# Patient Record
Sex: Male | Born: 1935 | Race: White | Hispanic: No | Marital: Married
Health system: Southern US, Community
[De-identification: ages and names within clinical notes are randomized; demographics above are authoritative.]

---

## 2006-12-19 ENCOUNTER — Emergency Department: Payer: Self-pay | Admitting: Internal Medicine

## 2006-12-23 ENCOUNTER — Ambulatory Visit: Payer: Self-pay | Admitting: Physician Assistant

## 2010-03-13 ENCOUNTER — Inpatient Hospital Stay: Payer: Self-pay | Admitting: *Deleted

## 2012-02-11 ENCOUNTER — Inpatient Hospital Stay: Payer: Self-pay | Admitting: Internal Medicine

## 2012-02-11 LAB — URINALYSIS, COMPLETE
Ketone: NEGATIVE
Nitrite: NEGATIVE
Ph: 6 (ref 4.5–8.0)
Protein: NEGATIVE
Specific Gravity: 1.01 (ref 1.003–1.030)
Squamous Epithelial: NONE SEEN
WBC UR: NONE SEEN /HPF (ref 0–5)

## 2012-02-11 LAB — CBC
HCT: 35.8 % — ABNORMAL LOW (ref 40.0–52.0)
MCH: 32.9 pg (ref 26.0–34.0)
MCV: 98 fL (ref 80–100)
Platelet: 130 10*3/uL — ABNORMAL LOW (ref 150–440)
RBC: 3.66 10*6/uL — ABNORMAL LOW (ref 4.40–5.90)
RDW: 14.3 % (ref 11.5–14.5)
WBC: 7.3 10*3/uL (ref 3.8–10.6)

## 2012-02-11 LAB — COMPREHENSIVE METABOLIC PANEL
Albumin: 3.2 g/dL — ABNORMAL LOW (ref 3.4–5.0)
Alkaline Phosphatase: 113 U/L (ref 50–136)
Bilirubin,Total: 0.6 mg/dL (ref 0.2–1.0)
Calcium, Total: 8.3 mg/dL — ABNORMAL LOW (ref 8.5–10.1)
Chloride: 107 mmol/L (ref 98–107)
Co2: 25 mmol/L (ref 21–32)
Creatinine: 1.48 mg/dL — ABNORMAL HIGH (ref 0.60–1.30)
EGFR (African American): 46 — ABNORMAL LOW
EGFR (Non-African Amer.): 39 — ABNORMAL LOW
Glucose: 139 mg/dL — ABNORMAL HIGH (ref 65–99)
SGOT(AST): 36 U/L (ref 15–37)
SGPT (ALT): 26 U/L (ref 12–78)
Sodium: 139 mmol/L (ref 136–145)

## 2012-02-11 LAB — CK TOTAL AND CKMB (NOT AT ARMC)
CK, Total: 71 U/L (ref 35–232)
CK-MB: 1.5 ng/mL (ref 0.5–3.6)
CK-MB: 1.7 ng/mL (ref 0.5–3.6)

## 2012-02-11 LAB — PROTIME-INR: INR: 1

## 2012-02-11 LAB — TROPONIN I: Troponin-I: 0.21 ng/mL — ABNORMAL HIGH

## 2012-02-12 LAB — CBC WITH DIFFERENTIAL/PLATELET
Basophil #: 0.1 10*3/uL (ref 0.0–0.1)
Eosinophil %: 7.5 %
HCT: 30.5 % — ABNORMAL LOW (ref 40.0–52.0)
Lymphocyte #: 0.9 10*3/uL — ABNORMAL LOW (ref 1.0–3.6)
MCH: 32 pg (ref 26.0–34.0)
MCHC: 33.2 g/dL (ref 32.0–36.0)
MCV: 97 fL (ref 80–100)
Monocyte #: 1.3 x10 3/mm — ABNORMAL HIGH (ref 0.2–1.0)
Neutrophil #: 3.9 10*3/uL (ref 1.4–6.5)
Neutrophil %: 57.8 %
Platelet: 109 10*3/uL — ABNORMAL LOW (ref 150–440)
RBC: 3.16 10*6/uL — ABNORMAL LOW (ref 4.40–5.90)

## 2012-02-12 LAB — BASIC METABOLIC PANEL
Anion Gap: 7 (ref 7–16)
BUN: 24 mg/dL — ABNORMAL HIGH (ref 7–18)
Chloride: 106 mmol/L (ref 98–107)
Co2: 27 mmol/L (ref 21–32)
Creatinine: 1.33 mg/dL — ABNORMAL HIGH (ref 0.60–1.30)
EGFR (Non-African Amer.): 45 — ABNORMAL LOW
Potassium: 4.1 mmol/L (ref 3.5–5.1)
Sodium: 140 mmol/L (ref 136–145)

## 2012-02-12 LAB — CK TOTAL AND CKMB (NOT AT ARMC)
CK, Total: 64 U/L (ref 35–232)
CK-MB: 1.6 ng/mL (ref 0.5–3.6)

## 2012-02-13 LAB — BASIC METABOLIC PANEL
Calcium, Total: 8.3 mg/dL — ABNORMAL LOW (ref 8.5–10.1)
Chloride: 105 mmol/L (ref 98–107)
Co2: 27 mmol/L (ref 21–32)
EGFR (Non-African Amer.): 49 — ABNORMAL LOW
Osmolality: 283 (ref 275–301)
Potassium: 3.7 mmol/L (ref 3.5–5.1)
Sodium: 140 mmol/L (ref 136–145)

## 2012-02-14 ENCOUNTER — Ambulatory Visit: Payer: Self-pay | Admitting: Internal Medicine

## 2012-02-14 LAB — TROPONIN I: Troponin-I: 0.08 ng/mL — ABNORMAL HIGH

## 2012-02-14 LAB — CK TOTAL AND CKMB (NOT AT ARMC)
CK, Total: 71 U/L (ref 35–232)
CK-MB: 1.6 ng/mL (ref 0.5–3.6)

## 2012-02-14 LAB — MAGNESIUM: Magnesium: 2.1 mg/dL

## 2012-02-14 LAB — TSH: Thyroid Stimulating Horm: 2.36 u[IU]/mL

## 2012-02-15 LAB — CK TOTAL AND CKMB (NOT AT ARMC)
CK, Total: 63 U/L (ref 35–232)
CK-MB: 1.5 ng/mL (ref 0.5–3.6)

## 2012-02-15 LAB — TROPONIN I: Troponin-I: 0.11 ng/mL — ABNORMAL HIGH

## 2012-02-16 LAB — BASIC METABOLIC PANEL
Anion Gap: 9 (ref 7–16)
BUN: 17 mg/dL (ref 7–18)
Calcium, Total: 8.3 mg/dL — ABNORMAL LOW (ref 8.5–10.1)
Creatinine: 1.07 mg/dL (ref 0.60–1.30)
EGFR (African American): >60
EGFR (Non-African Amer.): 58 — ABNORMAL LOW
Glucose: 103 mg/dL — ABNORMAL HIGH (ref 65–99)
Potassium: 4.4 mmol/L (ref 3.5–5.1)

## 2012-02-16 LAB — CULTURE, BLOOD (SINGLE)

## 2012-02-22 ENCOUNTER — Inpatient Hospital Stay: Payer: Self-pay | Admitting: Internal Medicine

## 2012-02-22 LAB — CBC
HCT: 36.7 % — ABNORMAL LOW (ref 40.0–52.0)
HGB: 12.5 g/dL — ABNORMAL LOW (ref 13.0–18.0)
MCHC: 34 g/dL (ref 32.0–36.0)
MCV: 96 fL (ref 80–100)
RBC: 3.82 10*6/uL — ABNORMAL LOW (ref 4.40–5.90)
RDW: 14.3 % (ref 11.5–14.5)
WBC: 12.4 10*3/uL — ABNORMAL HIGH (ref 3.8–10.6)

## 2012-02-22 LAB — BASIC METABOLIC PANEL
BUN: 31 mg/dL — ABNORMAL HIGH (ref 7–18)
Calcium, Total: 8.4 mg/dL — ABNORMAL LOW (ref 8.5–10.1)
Creatinine: 1.3 mg/dL (ref 0.60–1.30)
EGFR (African American): 53 — ABNORMAL LOW
EGFR (Non-African Amer.): 46 — ABNORMAL LOW
Glucose: 126 mg/dL — ABNORMAL HIGH (ref 65–99)
Osmolality: 278 (ref 275–301)
Potassium: 4.5 mmol/L (ref 3.5–5.1)
Sodium: 135 mmol/L — ABNORMAL LOW (ref 136–145)

## 2012-02-22 LAB — CK TOTAL AND CKMB (NOT AT ARMC)
CK, Total: 90 U/L (ref 35–232)
CK-MB: 2.9 ng/mL (ref 0.5–3.6)
CK-MB: 2.7 ng/mL (ref 0.5–3.6)

## 2012-02-22 LAB — TROPONIN I: Troponin-I: 1 ng/mL — ABNORMAL HIGH

## 2012-02-23 LAB — BASIC METABOLIC PANEL
Anion Gap: 9 (ref 7–16)
Calcium, Total: 8.6 mg/dL (ref 8.5–10.1)
Co2: 26 mmol/L (ref 21–32)
Creatinine: 1.47 mg/dL — ABNORMAL HIGH (ref 0.60–1.30)
EGFR (African American): 46 — ABNORMAL LOW
Osmolality: 284 (ref 275–301)
Potassium: 4.3 mmol/L (ref 3.5–5.1)

## 2012-02-23 LAB — DIGOXIN LEVEL: Digoxin: 1.39 ng/mL

## 2012-02-23 LAB — CK TOTAL AND CKMB (NOT AT ARMC)
CK, Total: 70 U/L (ref 35–232)
CK-MB: 3.7 ng/mL — ABNORMAL HIGH (ref 0.5–3.6)

## 2012-02-23 LAB — TROPONIN I: Troponin-I: 1.2 ng/mL — ABNORMAL HIGH

## 2012-02-27 ENCOUNTER — Ambulatory Visit: Payer: Self-pay | Admitting: Internal Medicine

## 2012-04-28 DEATH — deceased

## 2013-03-10 IMAGING — CR DG CHEST 2V
1 series · 3 of 3 positions shown · non-contrast
Comparison: none

REASON FOR EXAM: sob
COMMENTS:

[Series 1: x chest ap · 0.14mm/px · 3 of 3 slices shown]
[im 1/3]
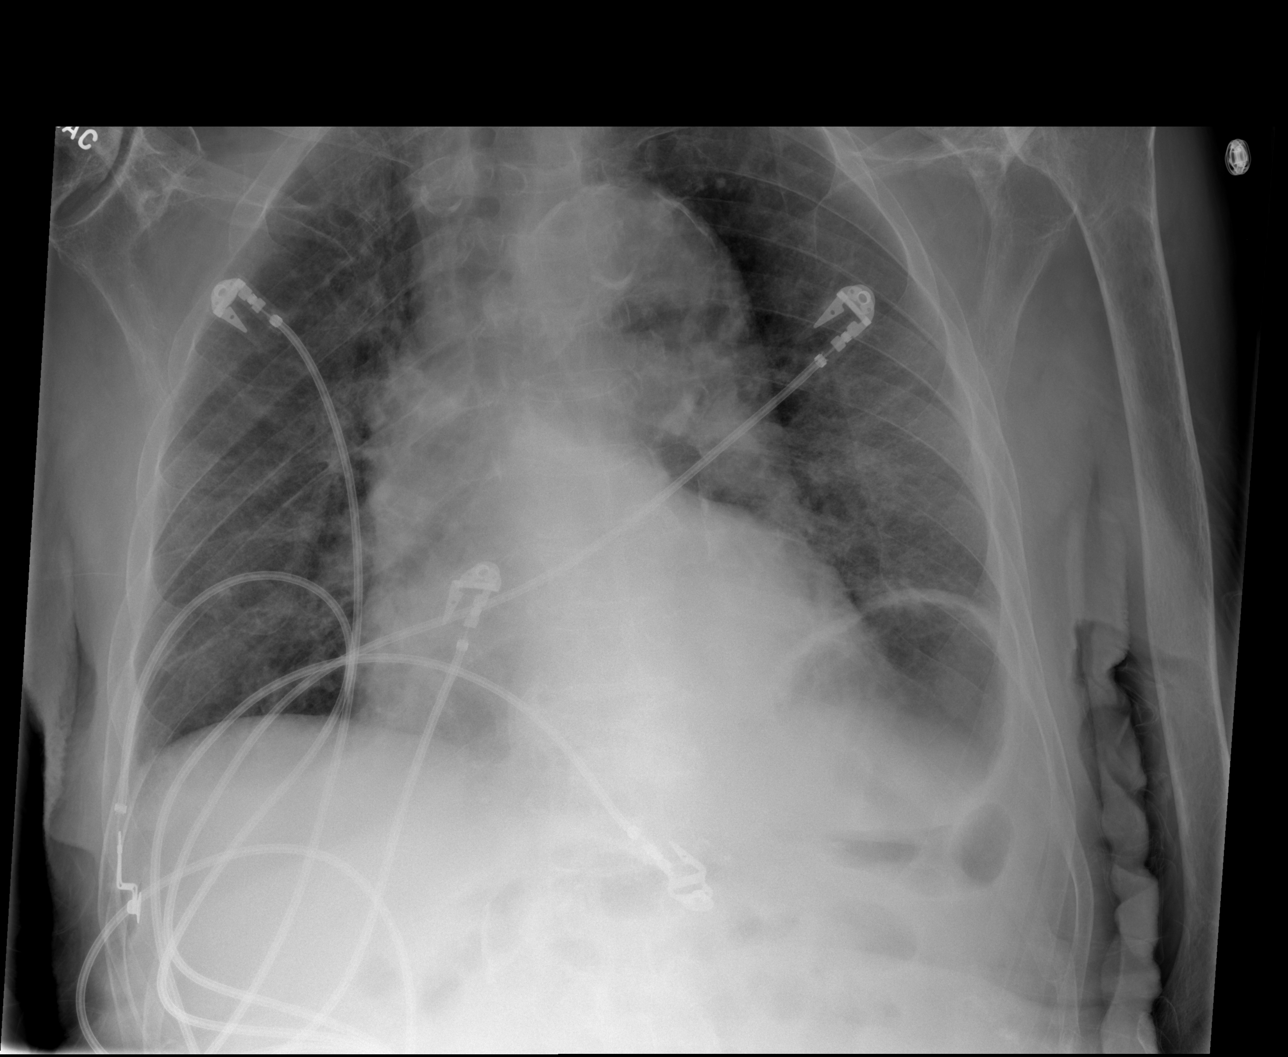
[im 2/3]
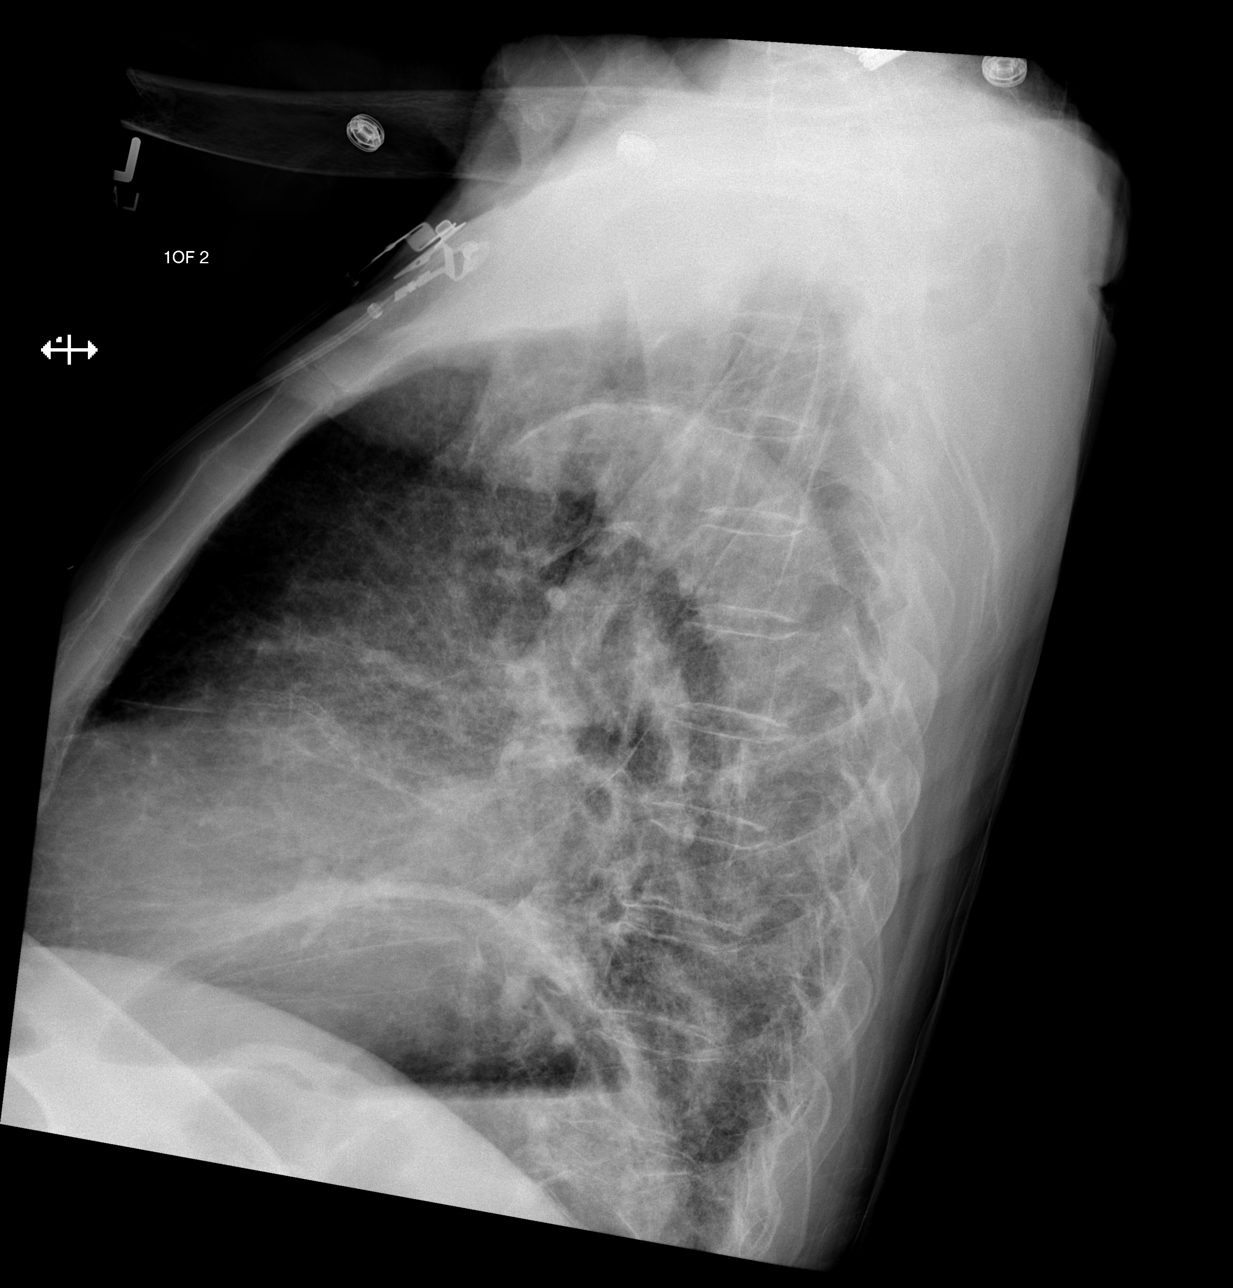
[im 3/3]
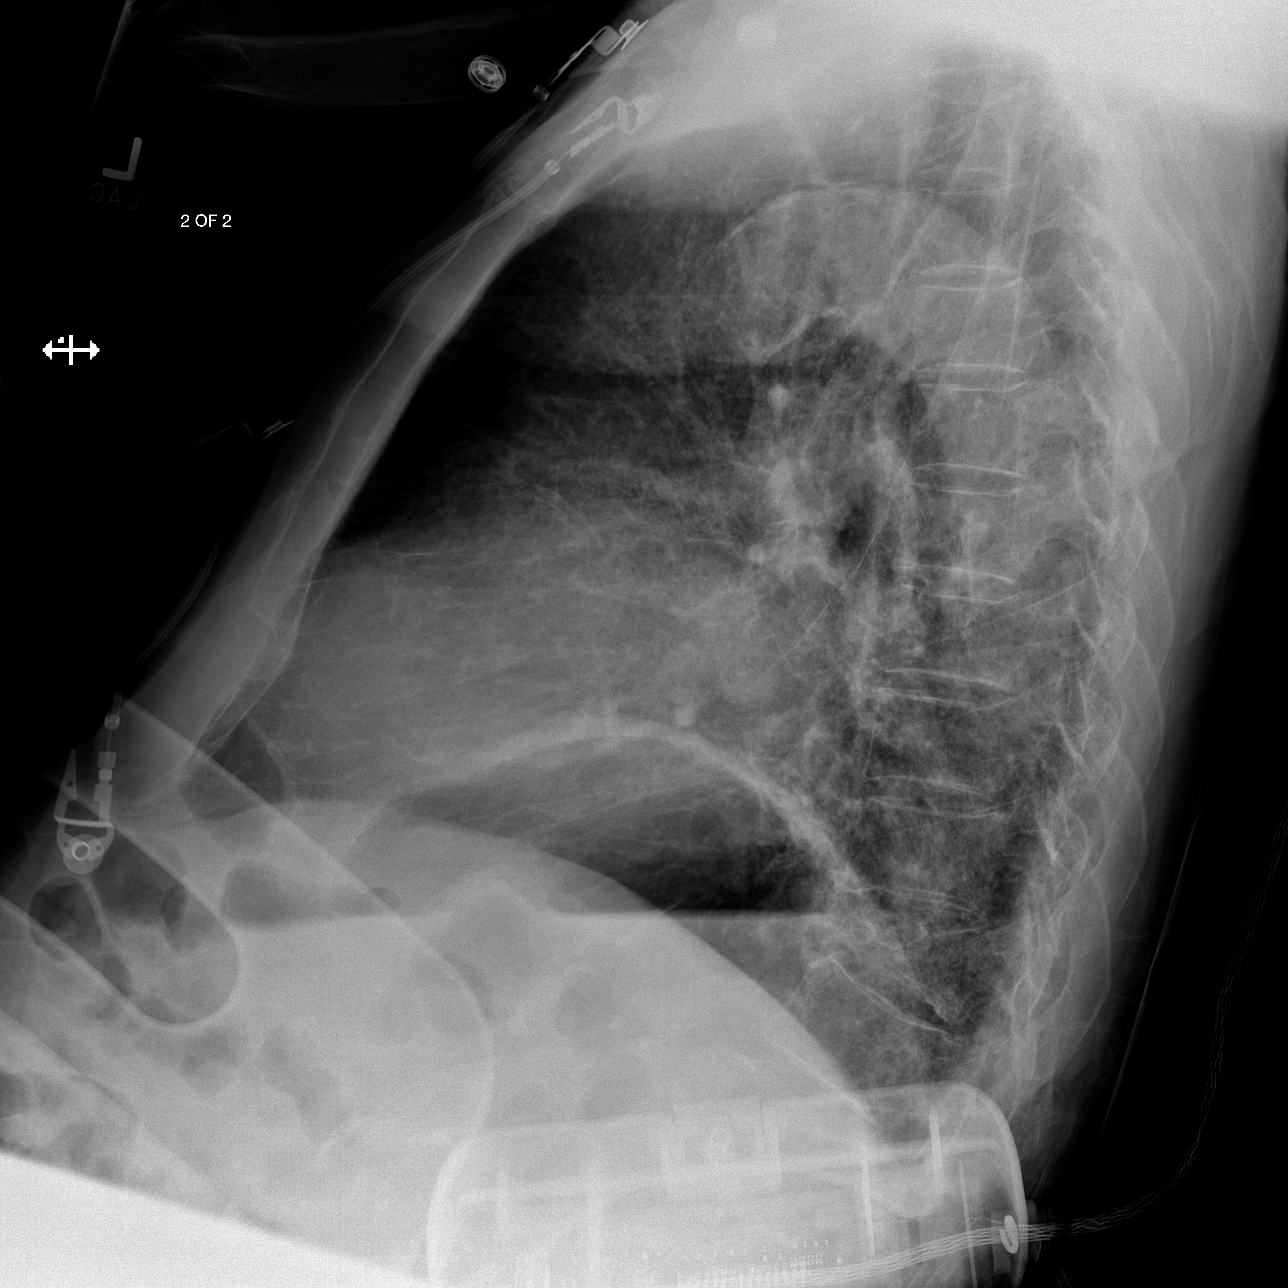

[3 of 3 positions shown; findings below may reference images not displayed]

PROCEDURE:     DXR - DXR CHEST PA (OR AP) AND LATERAL  - February 15, 2012  [DATE]

RESULT:     Comparison is made to the study 12 February, 2012.

The cardiac silhouette is enlarged. There is tortuosity of the descending
thoracic aorta. The pulmonary vascularity is somewhat indistinct and the
interstitial markings are more prominent today than on the earlier study.
There is no significant pleural fluid collection identified. The mediastinum
does not appear acutely widened.
IMPRESSION: The findings suggest low-grade CHF. There is likely
underlying COPD with an element of pulmonary fibrosis. No alveolar pneumonia
or significant pleural fluid collection is demonstrated.

[REDACTED]

## 2014-10-15 NOTE — Consult Note (Signed)
General Aspect patient is a 81 year old male with history of coronary artery disease status post PCI of the LAD in 2000, history of cardiomyopathy with ejection fraction of 30-35% with MR TR and aortic insufficiency, history of hyperlipidemia who now is readmitted with progressive failure to thrive, shortness of breath, weakness.  Chest x-ray reveals probable pulmonary edema.  Patient was admitted with similar complaints approximately 2 weeks ago.  He was treated with diuresis and discharged but now returns with recurrent symptoms.   Patient denies chest pain but complains of shortness of breath.  He is a somewhat difficult historian he appears to rule out for myocardial infarction.  He is currently being treated with diuretics.  Antibiotics ulcer being used.   Physical Exam:   GEN cachectic, thin    NECK No masses    RESP rhonchi  crackles    CARD Regular rate and rhythm  Murmur    Murmur Systolic  Diastolic    Systolic Murmur axilla    Diastolic Murmur Decrescendo    ABD denies tenderness  normal BS    LYMPH negative neck    SKIN normal to palpation    NEURO cranial nerves intact, motor/sensory function intact    PSYCH good insight   Review of Systems:   General: Fatigue  Weakness  Trouble sleeping    Skin: No Complaints    Respiratory: Short of breath    ROS Pt not able to provide ROS    Medications/Allergies Reviewed Medications/Allergies reviewed   Radiology Results: XRay:    27-Aug-13 14:03, Chest PA and Lateral   Chest PA and Lateral    REASON FOR EXAM:    SOB  COMMENTS:       PROCEDURE: DXR - DXR CHEST PA (OR AP) AND LATERAL  - Feb 22 2012  2:03PM     RESULT: Comparison: 02/15/2012 and 02/12/2012    Findings:  Heart size upper limits normal, similar to prior. The aorta is tortuous,   similar to prior. There are bilateral interstitial pulmonary opacities.   There is more focal opacity in the right lower lung which is new from   recent prior. There are  degenerative changes in the bilateral shoulders.   Humeral heads are high ridingbilaterally. There are severe anterior   wedge compression deformities in the lower thoracic and upper lumbar   spine, similar to prior.  IMPRESSION:   Findings of pulmonary edema. New small heterogeneous opacity in the right   lower lung may represent superimposed infection or atelectasis. Followup   PA and lateral radiograph is recommended.    Dictation site: 2          Verified By: Lewie Chamber, M.D., MD    No Known Allergies:     Impression 81 year old male with history of coronary disease valvular heart disease cardiomyopathy who now is readmitted with recurrent congestive heart failure and what appears to be failure to thrive.  He is a difficult historian complains of weakness.  He is on a nonrebreather mask.  He is currently hemodynamically stable and EKG suggests sinus rhythm although he is a teacher fibrillation in the past.  He is not a candidate for chronic anticoagulation.  Would agree with careful diuresis following renal function and blood pressure.  Would also agree with careful use of carvedilol following heart rate and blood pressure.   Agree with palliative care    Plan 1.  Careful diuresis 2.   low-sodium diet 3.  Careful use of carvedilol  falling blood pressure and heart rate. would continue with digoxin at low dose for now but closely follow for evidence of toxicity while diuresing. 4.  Agree with palliative care 5.  Poor prognosis.   Electronic Signatures: Dalia HeadingFath, Tevyn Codd A (MD)  (Signed 28-Aug-13 08:09)  Authored: General Aspect/Present Illness, History and Physical Exam, Review of System, Home Medications, EKG , Radiology, Allergies, Impression/Plan   Last Updated: 28-Aug-13 08:09 by Dalia HeadingFath, Burnette Valenti A (MD)

## 2014-10-15 NOTE — H&P (Signed)
PATIENT NAME:  Tom Price, Tom Price MR#:  [id number] DATE OF BIRTH:  [birth year]  DATE OF ADMISSION:  02/11/2012  REFERRING PHYSICIAN: Dr. Mindi JunkerGottlieb.   FAMILY PHYSICIAN: Dr. Randa LynnLamb.   REASON FOR ADMISSION: Acute respiratory failure with elevated troponin.   HISTORY OF PRESENT ILLNESS: The patient is a 81 year old male who lives at home with his wife but has caretakers during the day. He presents to the Emergency Room with cough, shortness of breath, and questionable fever. In the Emergency Room, the patient was noted to be hypoxic with a room air saturation of 85%. He was afebrile. Chest x-ray revealed interstitial edema/congestive heart failure. His troponin was mildly elevated. He denies chest pain. He is now admitted for further evaluation.   PAST MEDICAL HISTORY:  1. Benign hypertension.  2. Chronic abdominal pain.  3. Chronic dysphagia.  4. Gastroesophageal reflux disease.  5. Aortic insufficiency.  6. Peripheral vascular disease.  7. Diverticulosis.  8. Atherosclerotic cardiovascular disease.  9. History of rectal bleeding.  10. Senile dementia.   MEDICATIONS:  1. Toprol-XL 25 mg p.o. daily.  2. Norvasc 5 mg p.o. daily.  3. Ativan 0.5 mg 1 to 2 p.o. every four hours p.r.n.   ALLERGIES: No known drug allergies.   SOCIAL HISTORY: Negative for alcohol or tobacco abuse.   FAMILY HISTORY: Positive for stroke, but otherwise unremarkable.   REVIEW OF SYSTEMS: Unable to obtain due to his dementia.   PHYSICAL EXAMINATION:  GENERAL: The patient is in no acute distress.   VITAL SIGNS: Vital signs are currently remarkable for blood pressure of 112/63 with heart rate of 80 and a respiratory rate of 20. He is afebrile.   HEENT: Normocephalic, atraumatic. Pupils are equally round and reactive to light and accommodation. Extraocular movements are intact. Sclerae are anicteric. Conjunctivae are clear. Oropharynx is clear.   NECK: Supple without jugular venous distention. No adenopathy or  thyromegaly is noted.   LUNGS: Basilar crackles. Right-sided rhonchi. No wheezes. No dullness.   CARDIAC: Regular rate and rhythm with normal S1 and S2. No significant rubs, murmurs, or gallops. PMI is nondisplaced. Chest wall is nontender.   ABDOMEN: Soft, nontender, with normoactive bowel sounds. No organomegaly or masses were appreciated. No hernias or bruits were noted.   EXTREMITIES: Without clubbing, cyanosis, or edema. Pulses were 1+ bilaterally.   SKIN: Warm and dry without rash or lesions.   NEUROLOGIC: Cranial nerves II through XII grossly intact. Deep tendon reflexes were symmetric. Motor and sensory examination is nonfocal.   PSYCH: The patient was alert and oriented to person with intermittent confusion. He was not oriented to time or place.   LABORATORY, RADIOLOGICAL AND DIAGNOSTIC DATA: CK was 71 with an MB of 1.5 and a troponin of 0.15. White count was 7.3 with a hemoglobin of 12.0. Glucose was 139 with a BUN of 24 and a creatinine of 1.48 with a GFR of 39. Sodium 139 with a potassium of 4.2. Chest x-ray revealed bilateral diffuse interstitial thickening representing interstitial edema. EKG revealed sinus rhythm with left bundle branch block.   ASSESSMENT:  1. Acute respiratory failure.  2. Acute systolic congestive heart failure.  3. Abnormal EKG.  4. Elevated troponin.  5. Questionable pneumonia.  6. Dementia.   PLAN:  1. The patient will be admitted to telemetry with aspirin, Lovenox, beta blocker therapy, and oral nitrates at this time.  2. We will supplement oxygen and wean as tolerated.  3. We will follow serial cardiac enzymes and obtain an echocardiogram.  4. We will also obtain a cardiology consult.  5. We will diuresis with IV Lasix at this time and follow his renal function closely.  6. Follow up routine labs and chest x-ray in the morning.  7. We will begin empiric IV antibiotics with Xopenex and Atrovent SVNs because of the questionable  pneumonia/pneumonitis.  8. At this time, the patient is a FULL CODE according to family members.  9. We will consult physical therapy and care manager while he is in the hospital.  10. Further treatment and evaluation will depend upon the patient's progress.   TOTAL TIME SPENT ON ADMISSION: 50 minutes.   ____________________________ Duane Lope Judithann Sheen, MD jds:ap D: 02/11/2012 13:28:11 ET T: 02/11/2012 14:03:05 ET JOB#: [id number]  cc: Duane Lope. Judithann Sheen, MD, <Dictator> Reola Mosher. Randa Lynn, MD Lakeishia Truluck Rodena Medin MD ELECTRONICALLY SIGNED 02/11/2012 16:49

## 2014-10-15 NOTE — H&P (Signed)
PATIENT NAME:  Tom Price, Tom Price MR#:  [id number] DATE OF BIRTH:  Mar 27, 1914  DATE OF ADMISSION:  02/22/2012  PRIMARY CARE PHYSICIAN: Alonna Buckler, MD   CARDIOLOGIST: Harold Hedge, MD   PRESENTING COMPLAINT: Shortness of breath and low-grade fever.   HISTORY OF PRESENT ILLNESS: History is obtained from the patient, old records, and the patient's daughter, who is Midwife in the Emergency Room. Tom Price  is a very pleasant 81 year old Caucasian gentleman with a history of cardiomyopathy, systolic, ejection fraction of 30% with severe MR, TR and recently was admitted about a week ago with similar symptoms of congestive heart failure and possible pneumonia. The patient was sent home with Home Health nurses to follow up. He was recuperating well. However, for the past 2 to 3 days he has shown decline in his overall well-being with decreased appetite, increasing shortness of breath, cough, low-grade fever and generalized weakness. Home Health RN went to check on the patient today, and he was quite short of breath with saturations dropping in the upper 80s, and hence the patient was transferred to the Emergency Room for further evaluation and management. In the Emergency Room, the patient received 40 of IV Lasix, IV Levaquin one time dose with chest x-ray consistent with pulmonary edema, bilateral, and possible new right lower lobe infiltrate versus atelectasis. Tom Price is being admitted for further evaluation of his congestive heart failure, acute on chronic systolic, possible pneumonia and respiratory failure.   PAST MEDICAL HISTORY:  1. Hypertension.  2. Chronic abdominal pain.  3. Gastroesophageal reflux disease.  4. Aortic insufficiency.  5. MR and TR by echocardiogram of August 2013.  6. Peripheral vascular disease.  7. Diverticulosis.  8. Senile dementia.  9. History of atrial fibrillation with rapid ventricular response.   ALLERGIES: No known drug allergies.  SOCIAL  HISTORY: Negative for alcohol or tobacco use. The patient lives at home with his wife, who is 93 years old. He walks using a walker. The patient has [facility] through [facility].   MEDICATIONS:  1. Acetaminophen 325 mg, 2 tablets every 4 hours as needed.  2. Aspirin 81 mg daily.  3. Digoxin 0.125 mg p.o. daily.  4. Lasix 20 mg daily.  5. Iron capsule.  6. Lorazepam 0.5 mg, 1 to 2 every 4 hours as needed for anxiety.  7. Metoprolol succinate 25 mg extended release p.o. daily.   REVIEW OF SYSTEMS: Not much is obtainable since the patient has dementia. All he complains of is shortness of breath and cough.   PHYSICAL EXAMINATION:  GENERAL: The patient is an elderly cachectic-appearing elderly gentleman not in acute distress at this time.   VITAL SIGNS: He is currently afebrile. His temperature was 100.4 earlier. Pulse oximetry is 94% on 2 liters. Pulse is 85, blood pressure is 109/56.   HEENT: Atraumatic, normocephalic. Pupils are equal, round, and reactive to light and accommodation. Extraocular movements intact. Oral mucosa is dry.   NECK: Supple. No JVD. No carotid bruit.   RESPIRATORY: The patient has bilateral crackles up to the midlung. There is no use of accessory muscles at this time. No respiratory distress or labored breathing.   CARDIOVASCULAR: Both the heart sounds are normal. Rate and rhythm is regular. PMI is not lateralized. Chest is nontender.   EXTREMITIES: Good pedal pulses, good femoral pulses. Trace lower extremity edema.   ABDOMEN: Soft, benign, and nontender. No organomegaly. Positive bowel sounds.   NEUROLOGIC: Exam is limited secondary to the patient not  able to participate much. There are no focal neurological deficits noted. He is moving all his extremities very well.   PSYCHIATRIC: The patient is awake. He is alert and oriented to person. He does have some baseline dementia.   LABORATORY, DIAGNOSTIC AND RADIOLOGICAL DATA: His EKG shows sinus rhythm  with first degree AV block, left bundle branch block. White count is 12.4, hemoglobin and hematocrit is 12.5 and 36.7, platelet count is 125, MCV is 96. Glucose is 126, BUN is 31, creatinine is 1.3, sodium 135, potassium 4.5, chloride is 105, bicarbonate is 26, calcium 8.4. Troponin is 1.0. Chest x-ray shows findings of pulmonary edema, new small heterogenous opacity in the right lower lung which may be superimposed on infection or atelectasis.   ASSESSMENT: Tom Price is a 81 year old with history of congestive heart failure, chronic systolic with ejection fraction of 30%, severe MR, TR and AI, and history of hypertension comes in with:   1. Acute hypoxic respiratory failure due to acute on chronic systolic congestive heart failure, possible pneumonia, now with new infiltrate/atelectasis noted in the right lower lobe with fever, mild tachycardia, and hypoxemia: We will admit the patient to a telemetry floor. The patient is a NO CODE, DO NOT RESUSCITATE. We will continue oxygen, low-dose beta blockers. Hold off on ACE inhibitors given relative hypotension. We will start the patient now on Coreg and continue digoxin for now. We will also give Lasix 20 mg IV b.i.d., diurese slowly following ins and outs, daily weights, and creatinine. Echo was just done in August 2013, early part, hence we will not repeat it. Cardiology consultation as needed. We will also consider a Palliative Care consult given recurrent admission and overall general decline in the patient's health.  2. Acute on chronic systolic congestive heart failure with recent echocardiogram showing ejection fraction of 30% with severe MR and TR: We will continue treatment as outlined above.  3. Suspected right lower lobe pneumonia/atelectasis/pulmonary edema: We will give the patient IV Levaquin. The patient just completed a course of cefuroxime about a week ago. Continue oxygen and nebulizer treatment as needed. Follow blood cultures as well.   4. History of atrial fibrillation with rapid ventricular response: Currently the patient is in sinus rhythm. We will continue digoxin.  5. Elevated troponin, felt to be demand ischemia: The patient does have hypokinetic wall motion abnormality per echo.  Given his age and other comorbidities, he is not a candidate for an invasive procedure and neither is the family too keen on getting any aggressive work-up done. The patient had been seen by Cardiology about a week ago.  6. Mild dementia.  7. Valvular heart disease with severe MR, TR, and AI.  8. Deep vein thrombosis prophylaxis with subcutaneous Lovenox.  9. Further work-up according to the patient's clinical course.   The hospital admission plan was discussed with the patient and the patient's daughter, who is her MidwifeHealthcare Power of Attorney.   CODE STATUS:  The patient is a NO CODE/DO NOT RESUSCITATE.   TIME SPENT:   Critical care time was 55 minutes.  ____________________________ Wylie HailSona A. Allena KatzPatel, MD sap:cbb D: 02/22/2012 16:46:59 ET T: 02/22/2012 17:49:37 ET JOB#: [id number]  cc: Zong Mcquarrie A. Allena KatzPatel, MD, <Dictator> Reola MosherAndrew S. Randa LynnLamb, MD Darlin PriestlyKenneth A. Lady GaryFath, MD Willow OraSONA A Edgar Reisz MD ELECTRONICALLY SIGNED 02/29/2012 15:45

## 2014-10-15 NOTE — Consult Note (Signed)
Comments   I met with pt's son and 2 daughters. Updated them on pt's current medical condition. Family does not feel that pt can return home. We discussed the options of SNF with hospice vs the Hospice Home. Family would like pt to go to the Hospice Home. Liason notified and will see pt in AM.   Electronic Signatures: Elayjah Chaney, Izora Gala (MD)  (Signed 29-Aug-13 21:24)  Authored: Palliative Care   Last Updated: 29-Aug-13 21:24 by Brianah Hopson, Izora Gala (MD)

## 2014-10-15 NOTE — Discharge Summary (Signed)
PATIENT NAME:  Tom Price, Tom Price MR#:  [id number] DATE OF BIRTH:  1913-12-29  DATE OF ADMISSION:  02/22/2012 DATE OF DISCHARGE:  02/25/2012  PRESENTING COMPLAINT: Shortness of breath and cough.   DISCHARGE DIAGNOSES:  1. Progressively worsening acute on chronic systolic heart failure.  2. Severe cardiomyopathy with ejection fraction of 30%, severe tricuspid regurgitation, aortic insufficiency and mitral regurgitation.   3. Acute on chronic hypoxic respiratory failure secondary to systolic congestive heart failure with possible pneumonia.  4. Suspected right lower lobe pneumonia.  5. History of atrial fibrillation.  6. Dementia.  7. Failure to thrive.   CODE STATUS: NO CODE, DO NOT RESUSCITATE.   CONSULTANTS:  1. Dr. Ermalinda Memos, Palliative Care.  2. Dr. Ubaldo Glassing, Cardiology.   BRIEF SUMMARY OF HOSPITAL COURSE: [name] is a 81 year old Caucasian gentleman with past medical history of congestive heart failure, chronic systolic, with ejection fraction around 30% with severe MR, TR and AI, comes in with:   Acute hypoxic respiratory failure due to acute on chronic systolic congestive heart failure with possible pneumonia, presented with new infiltrate and atelectasis noted in the right lower lobe: He was admitted, started on IV Lasix and low-dose Coreg. The patient continued requiring a high dose of FiO2 with 50  to 60% Ventimask. He was weaned down to 4 to 6 liters of nasal cannula oxygen. The patient's condition did not improve any. This was the second admission in the last two weeks along with pneumonia and congestive heart failure.   Palliative Care, Dr. Ermalinda Memos, met with family members; and given the patient's age, underlying comorbidities and severe cardiomyopathy, they were in agreement with the patient being transferred to the Hospice Home for COMFORT MEASURES. Dr. Ubaldo Glassing was consulted from a Cardiology standpoint, and he was in agreement with the above management.   The hospital stay otherwise  remained stable.   CODE STATUS: The patient remained a NO CODE, DO NOT RESUSCITATE.   TIME SPENT: 40 minutes. ____________________________ Tom Rochester Posey Pronto, MD sap:cbb D: 02/25/2012 15:21:16 ET T: 02/25/2012 15:31:00 ET JOB#: [id number]  cc: [name], MD, <Dictator> [name] MD ELECTRONICALLY SIGNED 02/29/2012 15:45

## 2014-10-15 NOTE — Discharge Summary (Signed)
PATIENT NAME:  Tom Price, [name] MR#:  [id number] DATE OF BIRTH:  [birth year]  DATE OF ADMISSION:  02/11/2012 DATE OF DISCHARGE:  02/16/2012  ADMITTING DIAGNOSIS: Shortness of breath.    DISCHARGE DIAGNOSES:  1. Acute respiratory failure likely due to acute systolic congestive heart failure, possible pneumonia, now his symptoms improved.  2. Acute congestive heart failure due to systolic congestive heart failure.  3. Severe MR and severe TR.  4. Atrial fibrillation with RVR, patient on amiodarone, not a good anticoagulation candidate.  5. Left bundle branch block, which is chronic.  6. Elevated troponin felt to be demand ischemia per Cardiology. Also could be due to underlying coronary artery disease. The patient does have history of coronary artery disease.  7. Senile dementia with acute delirium during hospitalization, now resolved.  8. Acute renal failure due to diuresis, now improved.  9. Hypertension.  10. Chronic abdominal pain.  11. Chronic dysphagia.  12. Gastroesophageal reflux disease.  13. Peripheral vascular disease.  14. History of diverticulosis.  15. Previous history of rectal bleeding.   PERTINENT LABS AND EVALUATIONS: Admitting labs CPK 71 with a CK-MB of 1.5. Troponin 0.15. WBC count 7.3, hemoglobin 12.0, glucose 139, BUN 24, creatinine 1.48, sodium 139 with potassium of 4.2.   Chest x-ray revealed bilateral diffuse interstitial infiltrates likely representing interstitial edema.   EKG showed sinus rhythm with left bundle branch block.   Blood cultures x2 no growth at five days. His troponin went as high as 0.21.   His echocardiogram showed regional wall motion abnormality with EF 30% consistent with left ventricular outflow obstruction. Left atrium is mildly dilated. There is severe mitral regurgitation, moderate tricuspid regurgitation.  The patient's creatinine on August 21st was 1.07.   Chest x-ray showed finding suggestive of low-grade CHF. This is chest x-ray on  August 20th.    HOSPITAL COURSE: Please refer to history and physical done by the admitting physician. The patient is a 81 year old white male who lives with his wife but has a caretaker during the day who was brought to the ED with shortness of breath, some cough, possible fever. The patient's initial chest x-ray showed that he likely had CHF. The patient was placed on IV Lasix with good diuresis. He also was having productive cough so he was thought to have possible pneumonia as well so was treated with antibiotics. The patient's echo did show severe depressed EF as well as severe MR and TR. The patient was slow to improve. He was seen in consultation by Cardiology. They recommended diuresis and current treatment. The patient also developed atrial fibrillation with RVR during the hospitalization which was treated with a low dose beta-blocker and Digoxin which seemed to help control his symptoms. The patient also developed acute delirium during the hospitalization. He was monitored closely with intermittent confusion which is close to his baseline. He also was recommended to possibly go to a skilled nursing facility but the family opted for home. At this time he is doing much better. The patient's caregivers are instructed for heart failure instructions including weighing him each day.   DISCHARGE MEDICATIONS:  1. Metoprolol 25 one tab p.o. daily.  2. Metoprolol succinate 25 p.o. daily. 3. Lorazepam 0.5 mg 1 to 2 tabs q.4 p.r.n.  4. I-Cap one tab p.o. daily.  5. Tylenol 650 mg q.4 p.r.n. for pain. 6. Aspirin 81 one tab p.o. daily.  7. Digoxin 125 mcg daily.  8. Cefuroxime 500 one tab p.o. b.i.d. x3 days. 9. Lasix 20  one tab p.o. daily.   HOME OXYGEN: None.   DIET: Low sodium.   ACTIVITY: As tolerated with walker.  FOLLOW-UP:  1. Follow-up with Dr. Randa Lynn in 1 to 2 weeks.  2. Follow-up with Dr. Lady Gary in 2 to 4 weeks.   time spent  ____________________________ Lacie Scotts. Allena Katz,  MD shp:drc D: 02/17/2012 13:37:28 ET T: 02/17/2012 14:02:23 ET JOB#: [id number]  cc: Keyra Virella H. Allena Katz, MD, <Dictator> Reola Mosher. Randa Lynn, MD Darlin Priestly Lady Gary, MD Charise Carwin MD ELECTRONICALLY SIGNED 02/18/2012 16:02

## 2014-10-15 NOTE — Consult Note (Signed)
PATIENT NAME:  Tom Price, RANN MR#:  [id number] DATE OF BIRTH:  1913/08/07  DATE OF CONSULTATION:  02/11/2012  REFERRING PHYSICIAN:  Aram Beecham, MD   CONSULTING PHYSICIAN:  Lamar Blinks, MD  REASON FOR CONSULTATION: Congestive heart failure, elevated troponin, chronic kidney disease, aortic valve stenosis, and coronary artery disease.   CHIEF COMPLAINT: "I'm short of breath."   HISTORY OF PRESENT ILLNESS: The patient is a 81 year old male with known coronary artery disease, status post previous myocardial infarction and stent placement, with aortic valve disease and mild aortic stenosis who has had appropriate medication management for his hypertension and hyperlipidemia. The patient has been relatively stable with chronic kidney disease, but he has had new onset of shortness of breath, some mild amount of chest pressure, and was seen in the Emergency Room. At that time, the patient had significant pulmonary edema by chest x-ray with hypoxia. EKG shows normal sinus rhythm, left axis deviation, left bundle branch block. The patient has had an elevated troponin of 0.15.  The patient has had no other episodes of chest discomfort, and shortness of breath is improved with oxygen and diuretics.   REVIEW OF SYSTEMS: The remainder of review of systems is negative for vision change, ringing in the ears, hearing loss, cough, congestion, heartburn, nausea, vomiting, diarrhea, bloody stools, stomach pain, extremity pain, leg weakness, cramping of the buttocks, blood clots, headaches, blackouts, dizzy spells, nosebleeds, congestion, trouble swallowing, frequent urination, urination at night, muscle weakness, numbness, anxiety, depression, skin lesions, or skin rashes.   PAST MEDICAL HISTORY:  1. Coronary artery disease.  2. Aortic valve disease.  3. Chronic kidney disease.  4. Heart failure.   FAMILY HISTORY: Father had a stroke at an early age.   SOCIAL HISTORY: He currently denies alcohol or  tobacco use.   ALLERGIES: He has no known drug allergies.   CURRENT MEDICATIONS: As listed.   PHYSICAL EXAMINATION:  VITAL SIGNS: Blood pressure 112/68 bilaterally, heart rate 72 upright, reclining, and regular.   GENERAL: He is a well-appearing elderly male in no acute distress.   HEENT: No icterus, thyromegaly, ulcers, hemorrhage, or xanthelasma.   HEART: Regular rate and rhythm with normal S1, soft S2, with a 3/6 right upper sternal border murmur radiating throughout and into the carotids. Carotid upstroke normal with no radiation. Jugular venous pressure is normal.   LUNGS: Lungs have bibasilar crackles and decreased breath sounds.   ABDOMEN: Soft, nontender, without hepatosplenomegaly or masses. Abdominal aorta is normal size without bruit.   EXTREMITIES: 2+ radial, femoral, and dorsal pedal pulses with 1+ lower extremity edema. No cyanosis, clubbing, or ulcers.   NEUROLOGICAL: He is oriented to time, place, and person.   ASSESSMENT: A 81 year old male with acute congestive heart failure, elevated troponin consistent with demand ischemia and hypoxia, chronic kidney disease, aortic valve disease, coronary artery disease, needing further treatment.   RECOMMENDATIONS:  1. Gentle diuresis with intravenous diuretics for concerns of congestive heart failure, pulmonary edema and hypoxia, as well as watching chronic kidney disease.  2. Further treatment with beta blocker, if able, for possible LV dysfunction and congestive heart failure.  3. Serial ECG and enzymes to assess for demand ischemia versus myocardial infarction.  4. Echocardiogram for LV systolic dysfunction, valvular heart disease, and extent of aortic valve disease contributing to congestive heart failure.  5. Aspirin for further risk reduction in cardiovascular disease.  6. Further treatment options after above.  ____________________________ Lamar Blinks, MD [id number] D: 02/11/2012 16:00:09 ET T: 02/11/2012  16:34:52  ET JOB#: [id number]  cc: Lamar BlinksBruce J. Kowalski, MD, <Dictator> Lamar BlinksBRUCE J KOWALSKI MD ELECTRONICALLY SIGNED 02/12/2012 9:32
